# Patient Record
Sex: Female | Born: 1945 | Race: White | Hispanic: No | Smoking: Never smoker
Health system: Southern US, Community
[De-identification: ages and names within clinical notes are randomized; demographics above are authoritative.]

## PROBLEM LIST (undated history)

## (undated) DIAGNOSIS — I609 Nontraumatic subarachnoid hemorrhage, unspecified: Secondary | ICD-10-CM

## (undated) DIAGNOSIS — F32A Depression, unspecified: Secondary | ICD-10-CM

## (undated) DIAGNOSIS — K5792 Diverticulitis of intestine, part unspecified, without perforation or abscess without bleeding: Secondary | ICD-10-CM

## (undated) DIAGNOSIS — F329 Major depressive disorder, single episode, unspecified: Secondary | ICD-10-CM

## (undated) DIAGNOSIS — E785 Hyperlipidemia, unspecified: Secondary | ICD-10-CM

## (undated) DIAGNOSIS — I619 Nontraumatic intracerebral hemorrhage, unspecified: Secondary | ICD-10-CM

## (undated) DIAGNOSIS — R7303 Prediabetes: Secondary | ICD-10-CM

## (undated) DIAGNOSIS — I10 Essential (primary) hypertension: Secondary | ICD-10-CM

## (undated) HISTORY — PX: TONSILLECTOMY: SUR1361

## (undated) HISTORY — DX: Nontraumatic intracerebral hemorrhage, unspecified: I61.9

## (undated) HISTORY — DX: Prediabetes: R73.03

## (undated) HISTORY — DX: Nontraumatic subarachnoid hemorrhage, unspecified: I60.9

## (undated) HISTORY — PX: EYE SURGERY: SHX253

## (undated) HISTORY — DX: Major depressive disorder, single episode, unspecified: F32.9

## (undated) HISTORY — DX: Depression, unspecified: F32.A

## (undated) HISTORY — PX: TUMOR REMOVAL: SHX12

## (undated) HISTORY — DX: Diverticulitis of intestine, part unspecified, without perforation or abscess without bleeding: K57.92

---

## 1998-01-01 ENCOUNTER — Other Ambulatory Visit: Admission: RE | Admit: 1998-01-01 | Discharge: 1998-01-01 | Payer: Self-pay | Admitting: Gynecology

## 1998-03-01 HISTORY — PX: MYOMECTOMY: SHX85

## 2011-01-30 DIAGNOSIS — R7303 Prediabetes: Secondary | ICD-10-CM

## 2011-01-30 HISTORY — DX: Prediabetes: R73.03

## 2012-03-01 DIAGNOSIS — I609 Nontraumatic subarachnoid hemorrhage, unspecified: Secondary | ICD-10-CM

## 2012-03-01 HISTORY — DX: Nontraumatic subarachnoid hemorrhage, unspecified: I60.9

## 2012-05-06 ENCOUNTER — Inpatient Hospital Stay (HOSPITAL_COMMUNITY): Payer: BC Managed Care – PPO

## 2012-05-06 ENCOUNTER — Encounter (HOSPITAL_COMMUNITY): Payer: Self-pay | Admitting: *Deleted

## 2012-05-06 ENCOUNTER — Inpatient Hospital Stay (HOSPITAL_COMMUNITY)
Admission: EM | Admit: 2012-05-06 | Discharge: 2012-05-08 | DRG: 810 | Disposition: A | Discharge status: Home / Self Care | Payer: BC Managed Care – PPO | Source: Other Acute Inpatient Hospital | Attending: Neurosurgery | Admitting: Neurosurgery

## 2012-05-06 DIAGNOSIS — Z882 Allergy status to sulfonamides status: Secondary | ICD-10-CM

## 2012-05-06 DIAGNOSIS — I10 Essential (primary) hypertension: Secondary | ICD-10-CM | POA: Diagnosis present

## 2012-05-06 DIAGNOSIS — Z9089 Acquired absence of other organs: Secondary | ICD-10-CM

## 2012-05-06 DIAGNOSIS — I609 Nontraumatic subarachnoid hemorrhage, unspecified: Principal | ICD-10-CM | POA: Diagnosis present

## 2012-05-06 DIAGNOSIS — Z823 Family history of stroke: Secondary | ICD-10-CM

## 2012-05-06 DIAGNOSIS — E785 Hyperlipidemia, unspecified: Secondary | ICD-10-CM | POA: Diagnosis present

## 2012-05-06 HISTORY — DX: Essential (primary) hypertension: I10

## 2012-05-06 HISTORY — DX: Hyperlipidemia, unspecified: E78.5

## 2012-05-06 LAB — CREATININE, SERUM
Creatinine, Ser: 0.79 mg/dL (ref 0.50–1.10)
GFR calc non Af Amer: 86 mL/min — ABNORMAL LOW (ref >90)

## 2012-05-06 LAB — COMPREHENSIVE METABOLIC PANEL
Alkaline Phosphatase: 65 U/L (ref 39–117)
BUN: 21 mg/dL (ref 6–23)
CO2: 28 mEq/L (ref 19–32)
Calcium: 10 mg/dL (ref 8.4–10.5)
GFR calc Af Amer: >90 mL/min (ref >90)
GFR calc non Af Amer: 88 mL/min — ABNORMAL LOW (ref >90)
Glucose, Bld: 146 mg/dL — ABNORMAL HIGH (ref 70–99)
Total Protein: 6.9 g/dL (ref 6.0–8.3)

## 2012-05-06 LAB — CBC
HCT: 43.3 % (ref 36.0–46.0)
Hemoglobin: 15.3 g/dL — ABNORMAL HIGH (ref 12.0–15.0)
MCH: 33.3 pg (ref 26.0–34.0)
MCHC: 35.3 g/dL (ref 30.0–36.0)

## 2012-05-06 LAB — PROTIME-INR: Prothrombin Time: 12.7 seconds (ref 11.6–15.2)

## 2012-05-06 LAB — APTT: aPTT: 28 seconds (ref 24–37)

## 2012-05-06 LAB — BUN: BUN: 26 mg/dL — ABNORMAL HIGH (ref 6–23)

## 2012-05-06 LAB — SEDIMENTATION RATE: Sed Rate: 16 mm/hr (ref 0–22)

## 2012-05-06 MED ORDER — SODIUM CHLORIDE 0.9 % IV SOLN
INTRAVENOUS | Status: DC
  Administered 2012-05-06 (×2): via INTRAVENOUS

## 2012-05-06 MED ORDER — IOHEXOL 350 MG/ML SOLN
50.0000 mL | Freq: Once | INTRAVENOUS | Status: AC | PRN
  Administered 2012-05-06: 50 mL via INTRAVENOUS

## 2012-05-06 MED ORDER — DIAZEPAM 5 MG PO TABS
ORAL_TABLET | ORAL | Status: AC
  Filled 2012-05-06: qty 1

## 2012-05-06 MED ORDER — ACETAMINOPHEN-CODEINE #3 300-30 MG PO TABS: 1.0000 | ORAL_TABLET | ORAL | Status: DC | PRN

## 2012-05-06 MED ORDER — LABETALOL HCL 5 MG/ML IV SOLN: 10.0000 mg | INTRAVENOUS | Status: DC | PRN

## 2012-05-06 MED ORDER — LISINOPRIL 10 MG PO TABS
10.0000 mg | ORAL_TABLET | Freq: Every day | ORAL | Status: DC
  Administered 2012-05-08: 10 mg via ORAL
  Filled 2012-05-06 (×3): qty 1

## 2012-05-06 MED ORDER — PANTOPRAZOLE SODIUM 40 MG IV SOLR: 40.0000 mg | Freq: Every day | INTRAVENOUS | Status: DC

## 2012-05-06 MED ORDER — IOHEXOL 350 MG/ML SOLN: 50.0000 mL | Freq: Once | INTRAVENOUS | Status: DC | PRN

## 2012-05-06 MED ORDER — ACETAMINOPHEN 650 MG RE SUPP: 650.0000 mg | RECTAL | Status: DC | PRN

## 2012-05-06 MED ORDER — ONDANSETRON HCL 4 MG/2ML IJ SOLN: 4.0000 mg | Freq: Four times a day (QID) | INTRAMUSCULAR | Status: DC | PRN

## 2012-05-06 MED ORDER — HYDROCODONE-ACETAMINOPHEN 5-325 MG PO TABS
1.0000 | ORAL_TABLET | ORAL | Status: DC | PRN
  Administered 2012-05-06 (×2): 1 via ORAL
  Filled 2012-05-06 (×2): qty 1

## 2012-05-06 MED ORDER — DIAZEPAM 5 MG PO TABS
10.0000 mg | ORAL_TABLET | Freq: Once | ORAL | Status: AC
  Administered 2012-05-06: 10 mg via ORAL
  Filled 2012-05-06: qty 1

## 2012-05-06 MED ORDER — GADOBENATE DIMEGLUMINE 529 MG/ML IV SOLN
15.0000 mL | Freq: Once | INTRAVENOUS | Status: AC | PRN
  Administered 2012-05-06: 15 mL via INTRAVENOUS

## 2012-05-06 MED ORDER — ACETAMINOPHEN 325 MG PO TABS: 650.0000 mg | ORAL_TABLET | ORAL | Status: DC | PRN

## 2012-05-06 MED ORDER — NIMODIPINE 30 MG PO CAPS
60.0000 mg | ORAL_CAPSULE | ORAL | Status: DC
  Administered 2012-05-06 (×3): 60 mg via ORAL
  Filled 2012-05-06 (×9): qty 2

## 2012-05-06 MED ORDER — SIMVASTATIN 20 MG PO TABS
20.0000 mg | ORAL_TABLET | Freq: Every day | ORAL | Status: DC
  Administered 2012-05-06 – 2012-05-07 (×2): 20 mg via ORAL
  Filled 2012-05-06 (×3): qty 1

## 2012-05-06 MED ORDER — NIMODIPINE 30 MG/ML ORAL SOLUTION
60.0000 mg | ORAL | Status: DC
  Filled 2012-05-06 (×7): qty 2

## 2012-05-06 MED ORDER — PANTOPRAZOLE SODIUM 40 MG PO TBEC
40.0000 mg | DELAYED_RELEASE_TABLET | Freq: Every day | ORAL | Status: DC
  Administered 2012-05-06 – 2012-05-07 (×2): 40 mg via ORAL
  Filled 2012-05-06 (×2): qty 1

## 2012-05-06 MED ORDER — SENNOSIDES-DOCUSATE SODIUM 8.6-50 MG PO TABS
1.0000 | ORAL_TABLET | Freq: Two times a day (BID) | ORAL | Status: DC
  Administered 2012-05-06 (×2): 1 via ORAL
  Filled 2012-05-06 (×5): qty 1

## 2012-05-06 MED ORDER — NIMODIPINE 30 MG PO CAPS
30.0000 mg | ORAL_CAPSULE | ORAL | Status: DC
  Administered 2012-05-07 (×3): 30 mg via ORAL
  Filled 2012-05-06 (×5): qty 1

## 2012-05-06 NOTE — Progress Notes (Addendum)
MD notified of pt becoming hypotensive after dose of Nimotop. Orders to split dose and give 30 mg q 2 hours.Will continue to monitor pt.  Cyndie Chime Monument
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
## 2012-05-07 DIAGNOSIS — I10 Essential (primary) hypertension: Secondary | ICD-10-CM | POA: Diagnosis present

## 2012-05-07 DIAGNOSIS — E785 Hyperlipidemia, unspecified: Secondary | ICD-10-CM | POA: Diagnosis present

## 2012-05-07 DIAGNOSIS — I609 Nontraumatic subarachnoid hemorrhage, unspecified: Secondary | ICD-10-CM | POA: Diagnosis present

## 2012-05-07 MED ORDER — LORATADINE 10 MG PO TABS
10.0000 mg | ORAL_TABLET | Freq: Every day | ORAL | Status: DC
  Administered 2012-05-07 – 2012-05-08 (×2): 10 mg via ORAL
  Filled 2012-05-07 (×2): qty 1

## 2012-05-07 MED ORDER — ADULT MULTIVITAMIN W/MINERALS CH
1.0000 | ORAL_TABLET | Freq: Every day | ORAL | Status: DC
  Administered 2012-05-07 – 2012-05-08 (×2): 1 via ORAL
  Filled 2012-05-07 (×2): qty 1

## 2012-05-07 MED ORDER — SODIUM CHLORIDE 0.9 % IJ SOLN
10.0000 mL | Freq: Two times a day (BID) | INTRAMUSCULAR | Status: DC
  Administered 2012-05-07: 10 mL via INTRAVENOUS
  Administered 2012-05-07: 3 mL via INTRAVENOUS
  Administered 2012-05-08: 10 mL via INTRAVENOUS

## 2012-05-07 NOTE — Consult Note (Addendum)
Referring Physician: Dr. Venetia Maxon    Chief Complaint: Subarachnoid hemorrhage.  HPI: Debbie Phelps is an 67 y.o. female history of hypertension and hyperlipidemia who experienced a sudden severe headache at around 12 AM on 05/06/2012, followed shortly afterwards by weakness and tingling involving right arm and leg. She was seen in the emergency room at [facility] where CT scan was obtained which showed an acute left intracranial subarachnoid hemorrhage involving the region of the sylvian fissure as well as left parietal region. She was subsequently transferred to [facility] CT angiogram showed no evidence of intracranial aneurysm. MRI of the brain was unremarkable except for subarachnoid hemorrhage. Origin of subarachnoid hemorrhage remains unclear. Headache is markedly diminished in intensity. She is no longer experiencing motor and sensory abnormalities involving right extremities. INR and APTT were normal. Sedimentation rate and ANA are pending. Patient was not on antiplatelet therapy. NIH stroke score 0.  LSN: 12:00 AM on 05/06/2012 tPA Given: No: Acute SAH MRankin: 0  Past Medical History  Diagnosis Date  . Hypertension   . Hyperlipidemia     Family history: Positive for stroke involving paternal grandmother.  Medications:  Scheduled: . lisinopril  10 mg Oral Daily  . loratadine  10 mg Oral Daily  . multivitamin with minerals  1 tablet Oral Daily  . pantoprazole  40 mg Oral Q1200  . senna-docusate  1 tablet Oral BID  . simvastatin  20 mg Oral q1800   EAV:[id number], acetaminophen, acetaminophen-codeine, HYDROcodone-acetaminophen, labetalol, ondansetron (ZOFRAN) IV   Physical Examination: Blood pressure 124/50, pulse 63, temperature 97.6 F (36.4 C), temperature source Oral, resp. rate 20, height 5\' 3"  (1.6 m), weight 74.844 kg (165 lb), SpO2 99.00%.  Neurologic Examination: Mental Status: Alert, oriented, thought content appropriate.  Speech fluent without evidence of  aphasia. Able to follow commands without difficulty. Cranial Nerves: II-Visual fields were normal. III/IV/VI-Pupils were equal and reacted. Extraocular movements were full and conjugate; mild horizontal nystagmus in all fields of gaze.    V/VII-no facial numbness and no facial weakness. VIII-normal. X-normal speech and symmetrical palatal movement. Motor: 5/5 bilaterally with normal tone and bulk Sensory: Normal throughout. Deep Tendon Reflexes: Trace to 1+ and symmetric. Plantars: Flexor bilaterally Cerebellar: Normal finger-to-nose testing. Carotid auscultation: Normal  Ct Angio Head W/cm &/or Wo Cm  05/06/2012  *RADIOLOGY REPORT*  Clinical Data:  Subarachnoid hemorrhage  CT ANGIOGRAPHY HEAD  Technique:  Multidetector CT imaging of the head was performed using the standard protocol during bolus administration of intravenous contrast.  Multiplanar CT image reconstructions including MIPs were obtained to evaluate the vascular anatomy.  Contrast: 50mL OMNIPAQUE IOHEXOL 350 MG/ML SOLN  Comparison:  CT head 05/06/2012  Findings:  Subarachnoid hemorrhage over the convexity on the left is unchanged from earlier today.  There is a mild amount of subarachnoid blood present.  No subdural or intraparenchymal hemorrhage is seen.  There is a small amount of blood in the sylvian fissure on the left.  There is no blood at the base of the brain.  No acute infarct or mass.  Ventricle size is normal.  No enhancing mass lesion is seen following contrast infusion.  Both vertebral arteries are patent to the basilar.  PICA is patent bilaterally.  The basilar is widely patent.  Bilateral AICA, superior cerebellar, and posterior cerebral arteries are patent without stenosis.  Cavernous carotid is patent bilaterally without evidence of atherosclerotic disease or aneurysm.  Anterior and  middle cerebral arteries are patent bilaterally without stenosis.  Negative for aneurysm.  No vascular malformation or cause of subarachnoid  hemorrhage is identified.   Review of the MIP images confirms the above findings.  IMPRESSION: Subarachnoid hemorrhage over the convexity on the left, unchanged. No cause for hemorrhage is identified on CTA.   Original Report Authenticated By: Janeece Riggers, M.D.    Mr Laqueta Jean JX Contrast  05/06/2012  *RADIOLOGY REPORT*  Clinical Data: Subarachnoid hemorrhage.  Rule out tumor or vascular malformation  MRI HEAD WITHOUT AND WITH CONTRAST  Technique:  Multiplanar, multiecho pulse sequences of the brain and surrounding structures were obtained according to standard protocol without and with intravenous contrast  Contrast: 15mL MULTIHANCE GADOBENATE DIMEGLUMINE 529 MG/ML IV SOLN  Comparison: CTA from today  Findings: Subarachnoid hemorrhage over the convexity on the left is unchanged from the CT.  No subdural hemorrhage is present.  No edema is present in the brain.  No midline shift.  Ventricle size is normal.  No acute ischemic infarct.  No mass lesion is identified.  Postcontrast imaging reveals normal enhancement.  No underlying vascular malformation or mass is identified.  The underlying calvarium is intact.  IMPRESSION: Subarachnoid hemorrhage on the left.  No cause for hemorrhage is identified.   Original Report Authenticated By: Janeece Riggers, M.D.     Assessment: 67 y.o. female presenting with acute left intracranial subarachnoid hemorrhage of unclear source for hemorrhage. Aneurysm was not demonstrated on CT angiogram. No acute stroke was demonstrated on MRI study of the brain. Clotting times were normal.  Stroke Risk Factors - family history, hyperlipidemia and hypertension  Recommendations: 1. Agree with obtaining ESR and ANA to rule out indications of systemic vasculitis. 2. Workup to assess overall risk for stroke, including hemoglobin A1c, fasting lipid panel and 2D echocardiogram. 3. We'll have followup tomorrow with [facility] stroke team for further recommendations, if any.  C.R. Roseanne Reno, MD [facility]  [phone]  05/07/2012, 11:47 AM

## 2012-05-07 NOTE — Progress Notes (Signed)
Subjective: Patient reports feeling better this morning, but still some headache  Objective: Vital signs in last 24 hours: Temp:  [97.5 F (36.4 C)-98.6 F (37 C)] 97.8 F (36.6 C) (03/09 0355) Pulse Rate:  [56-85] 58 (03/09 0500) Resp:  [13-26] 14 (03/09 0500) BP: (85-131)/(33-77) 88/34 mmHg (03/09 0500) SpO2:  [91 %-99 %] 93 % (03/09 0500)  Intake/Output from previous day: 03/08 0701 - 03/09 0700 In: 2736.3 [P.O.:1320; I.V.:1416.3] Out: 2375 [Urine:2375] Intake/Output this shift: Total I/O In: 1275 [P.O.:600; I.V.:675] Out: 950 [Urine:950]  Physical Exam: PERRL, EOMI.  MAEW. Alert, oriented, conversant.  Denies any right arm symptoms.  Lab Results:  Recent Labs  05/06/12 0850  WBC 8.0  HGB 15.3*  HCT 43.3  PLT 220   BMET  Recent Labs  05/06/12 0450 05/06/12 0850  NA  --  144  K  --  4.4  CL  --  106  CO2  --  28  GLUCOSE  --  146*  BUN 26* 21  CREATININE 0.79 0.72  CALCIUM  --  10.0    Studies/Results: Ct Angio Head W/cm &/or Wo Cm  05/06/2012  *RADIOLOGY REPORT*  Clinical Data:  Subarachnoid hemorrhage  CT ANGIOGRAPHY HEAD  Technique:  Multidetector CT imaging of the head was performed using the standard protocol during bolus administration of intravenous contrast.  Multiplanar CT image reconstructions including MIPs were obtained to evaluate the vascular anatomy.  Contrast: 50mL OMNIPAQUE IOHEXOL 350 MG/ML SOLN  Comparison:  CT head 05/06/2012  Findings:  Subarachnoid hemorrhage over the convexity on the left is unchanged from earlier today.  There is a mild amount of subarachnoid blood present.  No subdural or intraparenchymal hemorrhage is seen.  There is a small amount of blood in the sylvian fissure on the left.  There is no blood at the base of the brain.  No acute infarct or mass.  Ventricle size is normal.  No enhancing mass lesion is seen following contrast infusion.  Both vertebral arteries are patent to the basilar.  PICA is patent bilaterally.  The  basilar is widely patent.  Bilateral AICA, superior cerebellar, and posterior cerebral arteries are patent without stenosis.  Cavernous carotid is patent bilaterally without evidence of atherosclerotic disease or aneurysm.  Anterior and  middle cerebral arteries are patent bilaterally without stenosis.  Negative for aneurysm.  No vascular malformation or cause of subarachnoid hemorrhage is identified.   Review of the MIP images confirms the above findings.  IMPRESSION: Subarachnoid hemorrhage over the convexity on the left, unchanged. No cause for hemorrhage is identified on CTA.   Original Report Authenticated By: Janeece Riggers, M.D.    Mr Laqueta Jean AV Contrast  05/06/2012  *RADIOLOGY REPORT*  Clinical Data: Subarachnoid hemorrhage.  Rule out tumor or vascular malformation  MRI HEAD WITHOUT AND WITH CONTRAST  Technique:  Multiplanar, multiecho pulse sequences of the brain and surrounding structures were obtained according to standard protocol without and with intravenous contrast  Contrast: 15mL MULTIHANCE GADOBENATE DIMEGLUMINE 529 MG/ML IV SOLN  Comparison: CTA from today  Findings: Subarachnoid hemorrhage over the convexity on the left is unchanged from the CT.  No subdural hemorrhage is present.  No edema is present in the brain.  No midline shift.  Ventricle size is normal.  No acute ischemic infarct.  No mass lesion is identified.  Postcontrast imaging reveals normal enhancement.  No underlying vascular malformation or mass is identified.  The underlying calvarium is intact.  IMPRESSION: Subarachnoid hemorrhage on the left.  No cause for hemorrhage is identified.   Original Report Authenticated By: Janeece Riggers, M.D.     Assessment/Plan: MRI negative, Sed rate 16, awaiting remainder of vasculitis workup.  Will have Neurology (Dr. Roseanne Reno) see her to evaluate for other reasons for subarachnoid hemorrhage.  I will transfer to floor.  I have discontinued nimodipine, given small hemorrhage and this was dropping  her blood pressure, even with split doses.    LOS: 1 day    Dorian Heckle, MD 05/07/2012, 5:20 AM

## 2012-05-08 DIAGNOSIS — I609 Nontraumatic subarachnoid hemorrhage, unspecified: Principal | ICD-10-CM

## 2012-05-08 DIAGNOSIS — I10 Essential (primary) hypertension: Secondary | ICD-10-CM

## 2012-05-08 DIAGNOSIS — E785 Hyperlipidemia, unspecified: Secondary | ICD-10-CM

## 2012-05-08 NOTE — Progress Notes (Signed)
OK to D/C home.  F/U with Dr. Pearlean Brownie as an outpatient for EEG and workup of SAH.

## 2012-05-08 NOTE — Progress Notes (Signed)
Stroke Team Progress Note  HISTORY Debbie Phelps is an 67 y.o. female history of hypertension and hyperlipidemia who experienced a sudden severe headache at around 12 AM on 05/06/2012, followed shortly afterwards by weakness and tingling involving right arm and leg. She was seen in the emergency room at [facility] where CT scan was obtained which showed an acute left intracranial subarachnoid hemorrhage involving the region of the sylvian fissure as well as left parietal region. She was subsequently transferred to [facility]. CT angiogram showed no evidence of intracranial aneurysm. MRI of the brain was unremarkable except for subarachnoid hemorrhage. Origin of subarachnoid hemorrhage remains unclear. Headache is markedly diminished in intensity. She is no longer experiencing motor and sensory abnormalities involving right extremities. INR and APTT were normal. Sedimentation rate and ANA are pending. Patient was not on antiplatelet therapy. NIH stroke score 0. Patient was not a TPA candidate secondary to [facility]. She was admitted for further evaluation and treatment.  SUBJECTIVE Her friend is at the bedside.  Overall she feels her condition is completely resolved. She reports a recent visit to a nutritionist who put her on multiple pills tid that she is not sure what they all are.  OBJECTIVE Most recent Vital Signs: Filed Vitals:   05/07/12 2255 05/08/12 0224 05/08/12 0543 05/08/12 0923  BP: 125/50 109/53 124/57 128/67  Pulse: 66 60 60 75  Temp: 97.9 F (36.6 C) 97.8 F (36.6 C) 97.8 F (36.6 C) 99 F (37.2 C)  TempSrc: Oral Oral Oral Oral  Resp: 16 16 16 17   Height:      Weight:      SpO2: 96% 98% 100% 98%   CBG (last 3)  No results found for this basename: GLUCAP,  in the last 72 hours  IV Fluid Intake:     MEDICATIONS  . lisinopril  10 mg Oral Daily  . loratadine  10 mg Oral Daily  . multivitamin with minerals  1 tablet Oral Daily  . pantoprazole  40 mg Oral Q1200  .  senna-docusate  1 tablet Oral BID  . simvastatin  20 mg Oral q1800  . sodium chloride  10 mL Intravenous Q12H   PRN:  acetaminophen, acetaminophen, acetaminophen-codeine, HYDROcodone-acetaminophen, labetalol, ondansetron (ZOFRAN) IV  Diet:  General thin liquids Activity:    Up with assistance DVT Prophylaxis:  SCDs   CLINICALLY SIGNIFICANT STUDIES Basic Metabolic Panel:  Recent Labs Lab 05/06/12 0450 05/06/12 0850  NA  --  144  K  --  4.4  CL  --  106  CO2  --  28  GLUCOSE  --  146*  BUN 26* 21  CREATININE 0.79 0.72  CALCIUM  --  10.0   Liver Function Tests:  Recent Labs Lab 05/06/12 0850  AST 21  ALT 20  ALKPHOS 65  BILITOT 0.5  PROT 6.9  ALBUMIN 3.9   CBC:  Recent Labs Lab 05/06/12 0850  WBC 8.0  HGB 15.3*  HCT 43.3  MCV 94.1  PLT 220   Coagulation:  Recent Labs Lab 05/06/12 0850  LABPROT 12.7  INR 0.96   Cardiac Enzymes: No results found for this basename: CKTOTAL, CKMB, CKMBINDEX, TROPONINI,  in the last 168 hours Urinalysis: No results found for this basename: COLORURINE, APPERANCEUR, LABSPEC, PHURINE, GLUCOSEU, HGBUR, BILIRUBINUR, KETONESUR, PROTEINUR, UROBILINOGEN, NITRITE, LEUKOCYTESUR,  in the last 168 hours Lipid Panel No results found for this basename: chol, trig, hdl, cholhdl, vldl, ldlcalc   HgbA1C  No results found for this basename: HGBA1C  Urine Drug Screen:   No results found for this basename: labopia, cocainscrnur, labbenz, amphetmu, thcu, labbarb    Alcohol Level: No results found for this basename: ETH,  in the last 168 hours  CT angio of the brain  Subarachnoid hemorrhage over the convexity on the left, unchanged. No cause for hemorrhage is identified on CTA.  MRI of the brain  05/06/2012   Subarachnoid hemorrhage on the left.  No cause for hemorrhage is identified.   Therapy Recommendations no OT,   Physical Exam    ASSESSMENT Debbie Phelps is a 67 y.o. female presenting with sudden severe headache and right  arm weakness and tingling.  She describes marching type symptoms, ? Seizure. Imaging confirms a left convexity localized subarachnoid hemorrhage. Hemorrhage felt to be secondary to possible cortical venous anomaly and aneurysm (suspicion very low) or RCVS (reversible cerebral vasoconstriction syndrome - often triggered by medications, hypertensive crisis, lupus, etc.), or small cortical vein abnormality that bled during coital onset.  ANA normal. On no antiplatelets prior to admission.  Patient with no resultant neuro symptoms.   Hospital day # 2  TREATMENT/PLAN  Recommend cerebral angio to look for small aneurym or other cause of hemorrhage as an OP next week or so  Stop all herbal nutritional supplements  OP EEG to eval for possible seizure. This is the highest risk event for this patient. Dr. Pearlean Brownie also discussed sensory seizures and how to recognize them. Dr. Pearlean Brownie can do the EEG in his office if desired.  F/u CRP and ANA  Ok for discharge later today  Follow up Dr. Pearlean Brownie in 1 week.  Dr. Pearlean Brownie discussed diagnosis, prognosis and plan of care with patient and Dr. Mal Amabile, MSN, RN, ANVP-BC, ANP-BC, GNP-BC [facility] Pager: [phone] 05/08/2012 11:19 AM  I have personally obtained a history, examined the patient, evaluated imaging results, and formulated the assessment and plan of care. I agree with the above.  Delia Heady, MD

## 2012-05-08 NOTE — Progress Notes (Signed)
Subjective: Patient reports "I'm ready to go, I think"  Objective: Vital signs in last 24 hours: Temp:  [97.8 F (36.6 C)-99 F (37.2 C)] 99 F (37.2 C) (03/10 0923) Pulse Rate:  [60-75] 75 (03/10 0923) Resp:  [16-20] 17 (03/10 0923) BP: (109-152)/(50-67) 128/67 mmHg (03/10 0923) SpO2:  [96 %-100 %] 98 % (03/10 0923)  Intake/Output from previous day: 03/09 0701 - 03/10 0700 In: 390 [P.O.:240; I.V.:150] Out: 525 [Urine:525] Intake/Output this shift:    Dressed, sitting up in chair, talking with PT.  Denies pain, discomfort, h/a, or dizziness. PEARL.   Lab Results:  Recent Labs  05/06/12 0850  WBC 8.0  HGB 15.3*  HCT 43.3  PLT 220   BMET  Recent Labs  05/06/12 0450 05/06/12 0850  NA  --  144  K  --  4.4  CL  --  106  CO2  --  28  GLUCOSE  --  146*  BUN 26* 21  CREATININE 0.79 0.72  CALCIUM  --  10.0    Studies/Results: Mr Laqueta Jean HY Contrast  05/06/2012  *RADIOLOGY REPORT*  Clinical Data: Subarachnoid hemorrhage.  Rule out tumor or vascular malformation  MRI HEAD WITHOUT AND WITH CONTRAST  Technique:  Multiplanar, multiecho pulse sequences of the brain and surrounding structures were obtained according to standard protocol without and with intravenous contrast  Contrast: 15mL MULTIHANCE GADOBENATE DIMEGLUMINE 529 MG/ML IV SOLN  Comparison: CTA from today  Findings: Subarachnoid hemorrhage over the convexity on the left is unchanged from the CT.  No subdural hemorrhage is present.  No edema is present in the brain.  No midline shift.  Ventricle size is normal.  No acute ischemic infarct.  No mass lesion is identified.  Postcontrast imaging reveals normal enhancement.  No underlying vascular malformation or mass is identified.  The underlying calvarium is intact.  IMPRESSION: Subarachnoid hemorrhage on the left.  No cause for hemorrhage is identified.   Original Report Authenticated By: Janeece Riggers, M.D.     Assessment/Plan: Improved   LOS: 2 days  Per Dr.  Marca Ancona note, Stroke Team will visit today.  Will discuss plan with Dr. Venetia Maxon this am.   Georgiann Cocker 05/08/2012, 9:59 AM

## 2012-05-08 NOTE — Discharge Summary (Signed)
Physician Discharge Summary  Patient ID: Debbie Phelps MRN: [id number] DOB/AGE: January 30, 1946 67 y.o.  Admit date: 05/06/2012 Discharge date: 05/08/2012  Admission Diagnoses:Subarachnoid hemorrhage  Discharge Diagnoses: Same Active Problems:   SAH (subarachnoid hemorrhage)   Essential hypertension, benign   Other and unspecified hyperlipidemia   Discharged Condition: good  Hospital Course: Workup did not reveal source for hemorrhage, including CTA, MRI.  Vasculitis work up negative.  Consults: neurology  Significant Diagnostic Studies: labs: CRP, ANA, Sed Rate, MRI brain, CTAngiogram  Treatments: IV hydration, analgesics  Discharge Exam: Blood pressure 128/67, pulse 75, temperature 99 F (37.2 C), temperature source Oral, resp. rate 17, height 5\' 3"  (1.6 m), weight 74.844 kg (165 lb), SpO2 98.00%. Neurologic: Alert and oriented X 3, normal strength and tone. Normal symmetric reflexes. Normal coordination and gait  Disposition: Home     Medication List    STOP taking these medications       FISH OIL PO     OVER THE COUNTER MEDICATION      TAKE these medications       CALCIUM + D PO  Take 1 tablet by mouth daily.     CLARITIN PO  Take 1 tablet by mouth daily.     lisinopril 10 MG tablet  Commonly known as:  PRINIVIL,ZESTRIL  Take 10 mg by mouth daily.     multivitamin with minerals Tabs  Take 1 tablet by mouth daily.     simvastatin 20 MG tablet  Commonly known as:  ZOCOR  Take 20 mg by mouth daily.     vitamin C 1000 MG tablet  Take 1,000 mg by mouth daily.         Signed: Dorian Heckle, MD 05/08/2012, 12:22 PM

## 2012-05-08 NOTE — Evaluation (Signed)
Occupational Therapy Evaluation Patient Details Name: Debbie Phelps MRN: [id number] DOB: October 05, 1945 Today's Date: 05/08/2012 Time: 4098-1191 OT Time Calculation (min): 28 min  OT Assessment / Plan / Recommendation Clinical Impression  This 67 y.o. female admitted with Rt. parietal SAH with no obvious source.  Pt. has returned to baseline with no deficits noted.  Education completed.  No further OT needs identified    OT Assessment  Patient does not need any further OT services    Follow Up Recommendations  No OT follow up    Barriers to Discharge      Equipment Recommendations  None recommended by OT    Recommendations for Other Services    Frequency       Precautions / Restrictions Precautions Precautions: None       ADL  Eating/Feeding: Independent Where Assessed - Eating/Feeding: Chair Grooming: Wash/dry hands;Wash/dry face;Teeth care;Brushing hair;Applying makeup;Independent Where Assessed - Grooming: Unsupported standing Upper Body Bathing: Independent Where Assessed - Upper Body Bathing: Unsupported standing Lower Body Bathing: Independent Where Assessed - Lower Body Bathing: Unsupported standing Upper Body Dressing: Independent Where Assessed - Upper Body Dressing: Unsupported sitting Lower Body Dressing: Independent Where Assessed - Lower Body Dressing: Unsupported sit to stand Toilet Transfer: Independent Toilet Transfer Method: Sit to Barista: Comfort height toilet Toileting - Clothing Manipulation and Hygiene: Independent Where Assessed - Engineer, mining and Hygiene: Standing Tub/Shower Transfer: Designer, industrial/product Method: Ambulating Transfers/Ambulation Related to ADLs: Pt ambulates independently ADL Comments: Pt. reports she showered yesterday.  Pt is dressed and hoping to go home.  She has read the newspaper without difficulty, was texting on phone upon therapists enterance.  She feel she is  back to baseline.  Discussed with her residual fatigue that may occur and how it may impact IADLs including work, and to take rest breaks as necessary.     OT Diagnosis:    OT Problem List:   OT Treatment Interventions:     OT Goals    Visit Information  Last OT Received On: 05/08/12    Subjective Data  Subjective: "I think I will probably take the week off from work to ease back in" Patient Stated Goal: To go home   Prior Functioning     Home Living Lives With: Alone Available Help at Discharge: Available PRN/intermittently;Friend(s) Type of Home: House Home Access: Level entry Home Layout: One level Bathroom Shower/Tub: Engineer, manufacturing systems: Standard Home Adaptive Equipment: None Prior Function Level of Independence: Independent Able to Take Stairs?: Yes Driving: Yes Vocation: Full time employment Comments: works as a Teacher, adult education: No difficulties Dominant Hand: Right         Vision/Perception Vision - History Baseline Vision: 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) Visual History: Corrective eye surgery (for strabissmus) Patient Visual Report: No change from baseline Vision - Assessment Eye Alignment: Impaired (comment) (congenital strabissmus) Vision Assessment: Vision tested Ocular Range of Motion: Within Functional Limits Visual Fields: No apparent deficits Additional Comments: Pt able to read small print without difficulty, has located all items in room, navigated hallway without difficulty Perception Perception: Within Functional Limits Praxis Praxis: Intact   Cognition  Cognition Overall Cognitive Status: Appears within functional limits for tasks assessed/performed Arousal/Alertness: Awake/alert Orientation Level: Appears intact for tasks assessed Behavior During Session: [facility] for tasks performed    Extremity/Trunk Assessment Right Upper Extremity Assessment RUE ROM/Strength/Tone:  Within functional levels RUE Sensation: WFL - Light Touch RUE Coordination: WFL - gross/fine motor  Left Upper Extremity Assessment LUE ROM/Strength/Tone: Within functional levels LUE Sensation: WFL - Light Touch LUE Coordination: WFL - gross/fine motor Trunk Assessment Trunk Assessment: Normal     Mobility Bed Mobility Bed Mobility: Not assessed (Pt reports independence) Transfers Transfers: Sit to Stand;Stand to Sit Sit to Stand: 7: Independent Stand to Sit: 7: Independent Details for Transfer Assistance: No balance loss     Exercise     Balance     End of Session OT - End of Session Activity Tolerance: Patient tolerated treatment well Patient left: in chair  GO     Conarpe, Wendi M 05/08/2012, 10:07 AM

## 2012-05-09 LAB — ANTI-NUCLEAR AB-TITER (ANA TITER): ANA Titer 1: 1:40 {titer} — ABNORMAL HIGH

## 2012-05-17 ENCOUNTER — Telehealth: Payer: Self-pay | Admitting: *Deleted

## 2012-05-17 ENCOUNTER — Encounter: Payer: Self-pay | Admitting: Neurology

## 2012-05-17 ENCOUNTER — Ambulatory Visit (INDEPENDENT_AMBULATORY_CARE_PROVIDER_SITE_OTHER): Payer: BC Managed Care – PPO | Admitting: Neurology

## 2012-05-17 VITALS — BP 134/61 | HR 82 | Temp 98.1°F | Ht 62.5 in | Wt 169.0 lb

## 2012-05-17 DIAGNOSIS — R209 Unspecified disturbances of skin sensation: Secondary | ICD-10-CM | POA: Insufficient documentation

## 2012-05-17 DIAGNOSIS — R569 Unspecified convulsions: Secondary | ICD-10-CM | POA: Insufficient documentation

## 2012-05-17 DIAGNOSIS — I619 Nontraumatic intracerebral hemorrhage, unspecified: Secondary | ICD-10-CM

## 2012-05-17 MED ORDER — LEVETIRACETAM 500 MG PO TABS: 250.0000 mg | ORAL_TABLET | Freq: Two times a day (BID) | ORAL | Status: DC

## 2012-05-17 NOTE — Telephone Encounter (Signed)
Patient  had a Subarachnoid Hemorrhage on 05-06-2012 and stated since then she has been having seizures on her right side. Patient took ibuprofen sat 05-12-2012 which sent her to the hospital, patient stated her bleed started again which had been confirmed  Via  scan that showed she had a bleed but has stopped. Patient stated last night she had a spell of numbness on her whole right side it lasted for about . Patient wanted to know if you can give her something for her seizures. Please advise

## 2012-05-17 NOTE — Patient Instructions (Signed)
Plan start Keppra 250 mg twice a day for this sensory paresthesia episodes which may represent simple partial seizures. I discussed side effects with the patient and advised her to call me if needed. Check EEG for silent seizure activity and patient has already been scheduled to have a diagnostic cerebral catheter angiogram by radiology. She may return to work after one week. Return for followup with me in 3 months.

## 2012-05-17 NOTE — Progress Notes (Signed)
HPI: Debbie Phelps is a 62 year or Caucasian lady seen for followup after hospital consultation for brain hemorrhage on 05/06/12.she presented with sudden onset of severe headache followed shortly by tingling and weakness involving the right arm and leg. CT scan of the head and [facility] showed an acute left parietal subarachnoid hemorrhage in the region of the sylvian fissure and left parietal lobe. She was transferred to [facility] where a CT angiogram showed no evidence of intracranial aneurysm or AV malformation. MRI scan of the brain was unremarkable except for localized subarachnoid hemorrhage in this ago areas. The etiology for the origin of subarachnoid hemorrhage remained unclear. Cerebral catheter angiogram was not up [facility] interventional radiologist was out of town and was scheduled to be done as an outpatient next week. Patient's headache resolved but she started having some intermittent paresthesias on the right side after discharge. Rest of the evaluation in the hospital stay included ESR, ANA panel, correlation labs which were all negative. She was discharged to 4 days later and on 05/13/12 she developed sudden onset of paresthesias involving the right 40. This happened after she had taken 2 doses of ibuprofen for her headache when she had gone [place] a [place]. She had a repeat CT scan at [facility] which actually showed slightly improved appearance of the left parietal subarachnoid hemorrhage without any new findings..she had been taking some herbal pills for the last couple of months and was advised to stop them.  The patient called the office saying she has had 2 episodes of transient right hemibody paresthesias in the last 2 days requesting to be seen urgently and hence was worked into the schedule today ROS: 14 system review of systems positive for itching, allergies, headache, numbness, weakness, seizure, anxiety, not enough sleep, decreased energy and  change in appetite. Physical Exam General: well developed, well nourished middle aged Caucasian lady seated, in no evident distress but appears anxious Head: head normocephalic and atraumatic. Orohparynx benign Neck: supple with no carotid or supraclavicular bruits Cardiovascular: regular rate and rhythm, no murmurs Lungs are clear to auscultation Neurologic Exam Mental Status: Awake and fully alert. Oriented to place and time. Recent and remote memory intact. Attention span, concentration and fund of knowledge appropriate. Mood and affect appropriate.  Cranial Nerves: Fundoscopic exam reveals sharp disc margins. Pupils equal, briskly reactive to light. Extraocular movements full without nystagmus. Visual fields full to confrontation. Hearing intact and symmetric to finer snap. Facial sensation intact. Face, tongue, palate move normally and symmetrically. Neck flexion and extension normal.  Motor: Normal bulk and tone. Normal strength in all tested extremity muscles. Sensory.: intact to tough and pinprick and vibratory.  Coordination: Rapid alternating movements normal in all extremities. Finger-to-nose and heel-to-shin performed accurately bilaterally. Gait and Station: Arises from chair without difficulty. Stance is normal. Gait demonstrates normal stride length and balance . Able to heel, toe and tandem walk without difficulty.  Reflexes: 1+ and symmetric. Toes downgoing.     ASSESSMENT::  70 lady with left parietal convexity localize subarachnoid hemorrhage of unclear etiology possibly small   cortical vein thrombosis. 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 Tiny aneurysm not seen on CT angiogram is very low probability. 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 Check EEG for seizure activity. Patient has her cerebral  catheter angiogram scheduled with Dr. Corliss Skains next week.  Return for followup in 3 months or call earlier if necessary.She may return to work on for one week.

## 2012-05-17 NOTE — Telephone Encounter (Signed)
If three-month visit spot is not available change it to 4 months

## 2012-05-18 ENCOUNTER — Other Ambulatory Visit: Payer: Self-pay | Admitting: Neurology

## 2012-05-18 DIAGNOSIS — I609 Nontraumatic subarachnoid hemorrhage, unspecified: Secondary | ICD-10-CM

## 2012-05-19 ENCOUNTER — Other Ambulatory Visit (INDEPENDENT_AMBULATORY_CARE_PROVIDER_SITE_OTHER): Payer: BC Managed Care – PPO

## 2012-05-19 ENCOUNTER — Other Ambulatory Visit: Payer: Self-pay | Admitting: Neurology

## 2012-05-19 ENCOUNTER — Other Ambulatory Visit: Payer: BC Managed Care – PPO

## 2012-05-19 DIAGNOSIS — R569 Unspecified convulsions: Secondary | ICD-10-CM

## 2012-05-19 DIAGNOSIS — I609 Nontraumatic subarachnoid hemorrhage, unspecified: Secondary | ICD-10-CM

## 2012-05-25 ENCOUNTER — Other Ambulatory Visit: Payer: Self-pay | Admitting: Radiology

## 2012-05-31 ENCOUNTER — Encounter (HOSPITAL_COMMUNITY): Payer: Self-pay | Admitting: Pharmacy Technician

## 2012-06-01 ENCOUNTER — Ambulatory Visit (HOSPITAL_COMMUNITY)
Admission: RE | Admit: 2012-06-01 | Discharge: 2012-06-01 | Disposition: A | Discharge status: Home / Self Care | Payer: BC Managed Care – PPO | Source: Ambulatory Visit | Attending: Neurology | Admitting: Neurology

## 2012-06-01 ENCOUNTER — Encounter (HOSPITAL_COMMUNITY): Payer: Self-pay

## 2012-06-01 ENCOUNTER — Other Ambulatory Visit: Payer: Self-pay | Admitting: Neurology

## 2012-06-01 DIAGNOSIS — I609 Nontraumatic subarachnoid hemorrhage, unspecified: Secondary | ICD-10-CM

## 2012-06-01 DIAGNOSIS — E785 Hyperlipidemia, unspecified: Secondary | ICD-10-CM | POA: Insufficient documentation

## 2012-06-01 DIAGNOSIS — I10 Essential (primary) hypertension: Secondary | ICD-10-CM | POA: Insufficient documentation

## 2012-06-01 DIAGNOSIS — R29898 Other symptoms and signs involving the musculoskeletal system: Secondary | ICD-10-CM | POA: Insufficient documentation

## 2012-06-01 DIAGNOSIS — Z8673 Personal history of transient ischemic attack (TIA), and cerebral infarction without residual deficits: Secondary | ICD-10-CM | POA: Insufficient documentation

## 2012-06-01 DIAGNOSIS — R51 Headache: Secondary | ICD-10-CM | POA: Insufficient documentation

## 2012-06-01 LAB — BASIC METABOLIC PANEL
Calcium: 9.9 mg/dL (ref 8.4–10.5)
GFR calc Af Amer: 85 mL/min — ABNORMAL LOW (ref >90)
GFR calc non Af Amer: 73 mL/min — ABNORMAL LOW (ref >90)
Glucose, Bld: 125 mg/dL — ABNORMAL HIGH (ref 70–99)
Potassium: 4.4 mEq/L (ref 3.5–5.1)
Sodium: 140 mEq/L (ref 135–145)

## 2012-06-01 LAB — PROTIME-INR
INR: 0.91 (ref 0.00–1.49)
Prothrombin Time: 12.2 seconds (ref 11.6–15.2)

## 2012-06-01 LAB — CBC WITH DIFFERENTIAL/PLATELET
Basophils Absolute: 0 10*3/uL (ref 0.0–0.1)
Basophils Relative: 0 % (ref 0–1)
Lymphocytes Relative: 31 % (ref 12–46)
MCHC: 35.3 g/dL (ref 30.0–36.0)
Neutro Abs: 3.1 10*3/uL (ref 1.7–7.7)
Neutrophils Relative %: 60 % (ref 43–77)
Platelets: 191 10*3/uL (ref 150–400)
RDW: 12.7 % (ref 11.5–15.5)
WBC: 5.1 10*3/uL (ref 4.0–10.5)

## 2012-06-01 LAB — APTT: aPTT: 20 seconds — ABNORMAL LOW (ref 24–37)

## 2012-06-01 MED ORDER — FENTANYL CITRATE 0.05 MG/ML IJ SOLN
INTRAMUSCULAR | Status: AC | PRN
  Administered 2012-06-01: 25 ug via INTRAVENOUS

## 2012-06-01 MED ORDER — HEPARIN SOD (PORK) LOCK FLUSH 100 UNIT/ML IV SOLN
INTRAVENOUS | Status: AC | PRN
  Administered 2012-06-01: 500 [IU] via INTRAVENOUS

## 2012-06-01 MED ORDER — IOHEXOL 300 MG/ML  SOLN
150.0000 mL | Freq: Once | INTRAMUSCULAR | Status: AC | PRN
  Administered 2012-06-01: 60 mL via INTRAVENOUS

## 2012-06-01 MED ORDER — MIDAZOLAM HCL 2 MG/2ML IJ SOLN
INTRAMUSCULAR | Status: AC | PRN
  Administered 2012-06-01: 1 mg via INTRAVENOUS

## 2012-06-01 MED ORDER — MIDAZOLAM HCL 2 MG/2ML IJ SOLN
INTRAMUSCULAR | Status: AC
  Filled 2012-06-01: qty 4

## 2012-06-01 MED ORDER — SODIUM CHLORIDE 0.9 % IV SOLN
INTRAVENOUS | Status: AC | PRN
  Administered 2012-06-01: 75 mL/h via INTRAVENOUS

## 2012-06-01 MED ORDER — SODIUM CHLORIDE 0.9 % IV SOLN: INTRAVENOUS | Status: DC

## 2012-06-01 MED ORDER — SODIUM CHLORIDE 0.9 % IV SOLN: INTRAVENOUS | Status: AC

## 2012-06-01 MED ORDER — FENTANYL CITRATE 0.05 MG/ML IJ SOLN
INTRAMUSCULAR | Status: AC
  Filled 2012-06-01: qty 2

## 2012-06-01 NOTE — Procedures (Signed)
S/P 4 vessle cerebral arteriogram . RT CFA approach. Findings .Marland Kitchen 1. No aneurysms,AVMs ,dissections or AV shunting seen. 2. No occlusions or stenosis seen. 3.Venous outflow WNLs.

## 2012-06-01 NOTE — H&P (Signed)
Debbie Phelps is an 67 y.o. female.   Chief Complaint: Headache; rt sided weakness Went to [facility] ER 05/06/2012 Transferred to [facility]: MRI/CTA reveals SAH; no source noted Pt has had some Rt arm numbness; tingling-- being treated with Keppra Scheduled now for cerebral arteriogram  HPI: HTN; HLD  Past Medical History  Diagnosis Date  . Hypertension   . Hyperlipidemia     Past Surgical History  Procedure Laterality Date  . Tonsillectomy    . Eye surgery    . Myomectomy  2000    History reviewed. No pertinent family history. Social History:  reports that she has never smoked. She has never used smokeless tobacco. She reports that she drinks about 1.2 ounces of alcohol per week. She reports that she does not use illicit drugs.  Allergies:  Allergies  Allergen Reactions  . Sulfa Antibiotics Other (See Comments)    Pt states "hands were stiff"     (Not in a hospital admission)  No results found for this or any previous visit (from the past 48 hour(s)). No results found.  Review of Systems  Constitutional: Negative for fever and weight loss.  Eyes: Negative for blurred vision.  Respiratory: Negative for shortness of breath.   Cardiovascular: Negative for chest pain.  Gastrointestinal: Negative for nausea, vomiting and abdominal pain.  Neurological: Positive for headaches. Negative for dizziness and weakness.    Blood pressure 134/73, pulse 70, temperature 97.7 F (36.5 C), temperature source Oral, resp. rate 20, height 5\' 3"  (1.6 m), weight 169 lb (76.658 kg), SpO2 98.00%. Physical Exam  Constitutional: She is oriented to person, place, and time. She appears well-developed and well-nourished.  Cardiovascular: Normal rate, regular rhythm and normal heart sounds.   No murmur heard. Respiratory: Effort normal and breath sounds normal. She has no wheezes.  GI: Soft. Bowel sounds are normal. There is no tenderness.  Musculoskeletal: Normal range of motion.   Neurological: She is alert and oriented to person, place, and time.  Psychiatric: She has a normal mood and affect. Her behavior is normal. Judgment and thought content normal.     Assessment/Plan HA evaluated in [facility] ER 05/06/12 and then to [facility] on MRI/CTA Scheduled now for cerebral arteriogram Pt aware of procedure benefits and risks and agreeable to proceed Consent signed and in chart  Debbie Phelps A 06/01/2012, 10:45 AM

## 2012-06-07 DIAGNOSIS — Z0289 Encounter for other administrative examinations: Secondary | ICD-10-CM

## 2012-06-08 ENCOUNTER — Telehealth: Payer: Self-pay | Admitting: *Deleted

## 2012-06-08 NOTE — Telephone Encounter (Signed)
Called patient to make a appointment, no answer lvm asking the patient to call me back.

## 2012-06-09 ENCOUNTER — Telehealth: Payer: Self-pay | Admitting: *Deleted

## 2012-06-09 NOTE — Telephone Encounter (Signed)
Patient already has refills on file for Keppra at pharmacy.  I called to advise she should remain on med, and can take Tylenol OTC, also that she needs to keep appt.  Got no answer.  Left message.

## 2012-06-09 NOTE — Telephone Encounter (Signed)
Patient stated Ir angio looks good and wants to know should she still be taking keppera 250 mg twice a day. Patient also wants to know what over the counter medication she can take should she have a headache.

## 2012-06-09 NOTE — Telephone Encounter (Signed)
She needs to continue Keppra. She can use and all extra strength 2 tablets every 6 hourly as needed for headache. Keep appointment with me.

## 2012-08-04 ENCOUNTER — Telehealth: Payer: Self-pay | Admitting: Neurology

## 2012-08-04 NOTE — Telephone Encounter (Signed)
I called and left a message for the patient that per nurse to avoid drinking alcohol because it can increase the side effects of Keppra and may increase seizure risk.

## 2012-10-19 ENCOUNTER — Encounter: Payer: Self-pay | Admitting: Neurology

## 2012-10-19 ENCOUNTER — Ambulatory Visit (INDEPENDENT_AMBULATORY_CARE_PROVIDER_SITE_OTHER): Payer: Medicare Other | Admitting: Neurology

## 2012-10-19 VITALS — BP 117/65 | HR 78 | Temp 98.1°F | Ht 63.0 in | Wt 172.0 lb

## 2012-10-19 DIAGNOSIS — R209 Unspecified disturbances of skin sensation: Secondary | ICD-10-CM

## 2012-10-19 NOTE — Patient Instructions (Signed)
She may be having personality changes and possible side effects from keppra. Taper Keppra to 250 mg at night for one week and discontinue. If she has breakthrough tingling numbness episodes suggestive of seizures may need to and alternative anticonvulsant. Return for followup in 6 months

## 2012-10-19 NOTE — Progress Notes (Signed)
HPI: [name] is a 25 year or Caucasian lady seen for followup after hospital consultation for brain hemorrhage on 05/06/12.she presented with sudden onset of severe headache followed shortly by tingling and weakness involving the right arm and leg. CT scan of the head at [facility] showed an acute left parietal subarachnoid hemorrhage in the region of the sylvian fissure and left parietal lobe. She was transferred to [facility] where a CT angiogram showed no evidence of intracranial aneurysm or AV malformation. MRI scan of the brain was unremarkable except for localized subarachnoid hemorrhage in the left sylvian fissure.s. The etiology for the origin of subarachnoid hemorrhage remained unclear. Cerebral catheter angiogram was not  done as interventional radiologist was out of town and was scheduled to be done as an outpatient next week. Patient's headache resolved but she started having some intermittent paresthesias on the right side after discharge. Rest of the evaluation in the hospital stay included ESR, ANA panel, coagulation labs which were all negative. She was discharged . 4 days later and on 05/13/12 she developed sudden onset of paresthesias involving the right 40. This happened after she had taken 2 doses of ibuprofen for her headache when she had gone on a trip to [place]. She had a repeat CT scan at [facility] which actually showed slightly improved appearance of the left parietal subarachnoid hemorrhage without any new findings..she had been taking some herbal pills for the last couple of months and was advised to stop them.  10/19/2012 she is seen for followup today after last visit on 05/18/12. She states she's had no further episodes of recurrent procedures after starting the Keppra. She is taking 250 twice a day but has noted that her personality has changed. She feels that she is quite hyper now and very anxious and thinks about small things. Every time she has  some minor tingling or numbness in her hands she is worried about seizures. She does have a history of carpal tunnel in the past. She has since retired from her job and is enjoying her time off and feels more relaxed . She had EEG on 05/19/12 which was normal and 4 vessel cerebral catheter angiogram on 06/06/12 by Dr. Corliss Skains which showed no evidence of AV malformation, aneurysms or cortical venous thrombosis. ROS: 14 system review of systems positive weight gain, rash, itching, allergies, skin sensitivity, joint pain, easy bruising, memory loss, insomnia and anxiety Filed Vitals:   10/19/12 1504  BP: 117/65  Pulse: 78  Temp: 98.1 F (36.7 C)    Physical Exam General: well developed, well nourished middle aged Caucasian lady seated, in no evident distress but appears anxious Head: head normocephalic and atraumatic. Orohparynx benign Neck: supple with no carotid or supraclavicular bruits Cardiovascular: regular rate and rhythm, no murmurs Lungs are clear to auscultation Neurologic Exam Mental Status: Awake and fully alert. Oriented to place and time. Recent and remote memory intact. Attention span, concentration and fund of knowledge appropriate. Mood and affect appropriate.  Cranial Nerves: Fundoscopic exam reveals sharp disc margins. Pupils equal, briskly reactive to light. Extraocular movements full without nystagmus. Visual fields full to confrontation. Hearing intact and symmetric to finer snap. Facial sensation intact. Face, tongue, palate move normally and symmetrically. Neck flexion and extension normal.  Motor: Normal bulk and tone. Normal strength in all tested extremity muscles. Sensory.: intact to tough and pinprick and vibratory.  Coordination: Rapid alternating movements normal in all extremities. Finger-to-nose and heel-to-shin performed accurately bilaterally. Gait and  Station: Arises from chair without difficulty. Stance is normal. Gait demonstrates normal stride length and  balance . Able to heel, toe and tandem walk without difficulty.  Reflexes: 1+ and symmetric. Toes downgoing.     ASSESSMENT::  33 lady with left parietal convexity localize subarachnoid hemorrhage of unclear etiology possibly small   cortical vein thrombosis. 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 If she has any breakthrough paresthesias/seizures may need to consider adding alternative anticonvulsant at that time. Return for followup in 6 months or call earlier if necessary.

## 2013-03-09 IMAGING — CT CT ANGIO HEAD
1 of 11 series · 6 of 33 positions shown · IV contrast (omnipaque)
Comparison: CT head 05/06/2012

CLINICAL DATA: Subarachnoid hemorrhage

CT ANGIOGRAPHY HEAD
TECHNIQUE: Multidetector CT imaging of the head was performed
using the standard protocol during bolus administration of
intravenous contrast.  Multiplanar CT image reconstructions
including MIPs were obtained to evaluate the vascular anatomy.
Contrast: 50mL OMNIPAQUE IOHEXOL 350 MG/ML SOLN

[axial 1x1 · axial · 0.46mm/px · z∈[+192,+299]mm · 6 of 273 slices shown]
[im 39/273  soft-tissue]
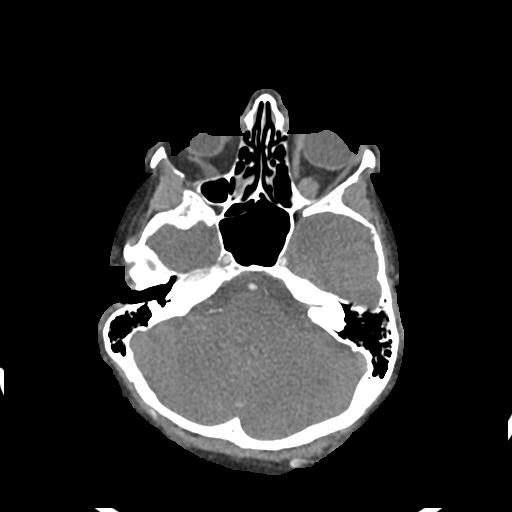
[im 78/273  bone]
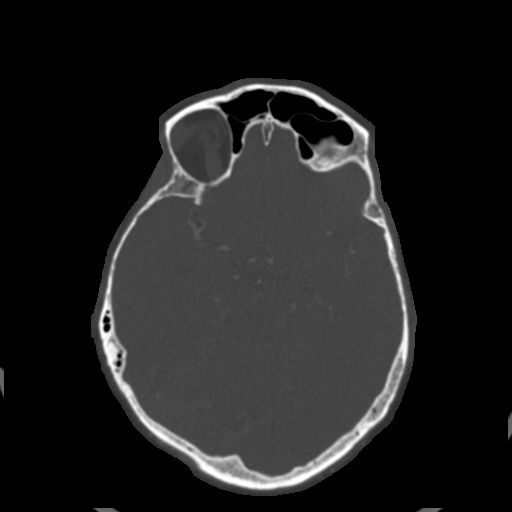
[im 117/273  soft-tissue]
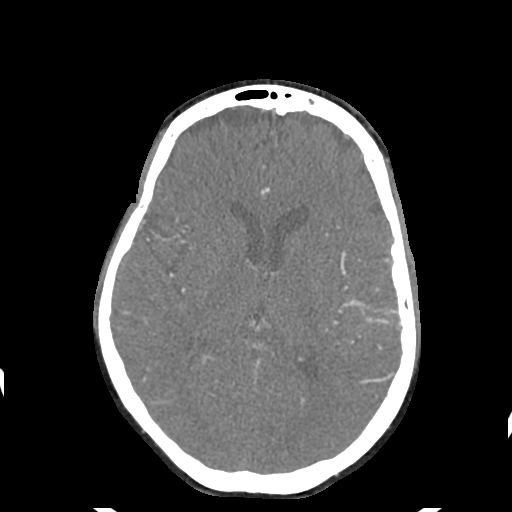
[im 156/273  bone]
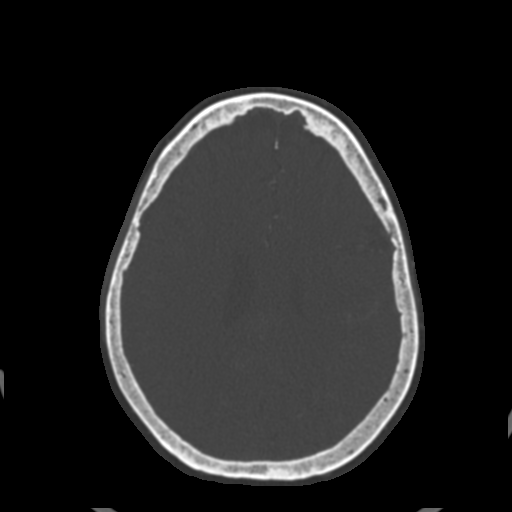
[im 195/273  soft-tissue]
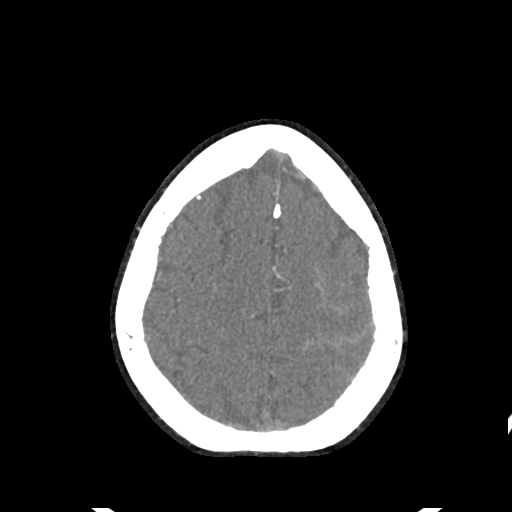
[im 234/273  bone]
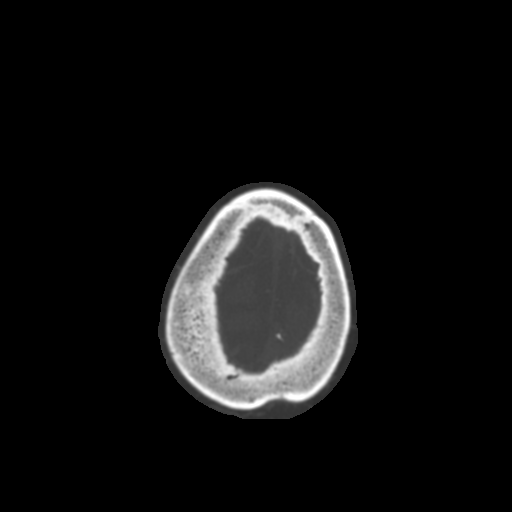

[6 of 33 positions shown; findings below may reference images not displayed]

FINDINGS: Subarachnoid hemorrhage over the convexity on the left
is unchanged from earlier today.  There is a mild amount of
subarachnoid blood present.  No subdural or intraparenchymal
hemorrhage is seen.  There is a small amount of blood in the
sylvian fissure on the left.  There is no blood at the base of the
brain.  No acute infarct or mass.  Ventricle size is normal.  No
enhancing mass lesion is seen following contrast infusion.

Both vertebral arteries are patent to the basilar.  PICA is patent
bilaterally.  The basilar is widely patent.  Bilateral AICA,
superior cerebellar, and posterior cerebral arteries are patent
without stenosis.

Cavernous carotid is patent bilaterally without evidence of
atherosclerotic disease or aneurysm.  Anterior and  middle cerebral
arteries are patent bilaterally without stenosis.

Negative for aneurysm.  No vascular malformation or cause of
subarachnoid hemorrhage is identified.

 Review of the MIP images confirms the above findings.
IMPRESSION: Subarachnoid hemorrhage over the convexity on the left, unchanged.
No cause for hemorrhage is identified on CTA.

## 2013-04-04 IMAGING — XA IR ANGIO INTRA EXTRACRAN SEL COM CAROTID INNOMINATE BILAT MOD SE
1 series · 13 of 24 positions shown · IV contrast (IODINE)
Comparison: CT angiogram of the brain 05/06/2012 and CT scan of the
brain of 05/13/2012.

CLINICAL DATA: Subarachnoid hemorrhage.

BILATERAL COMMON CAROTID ARTERY AND BILATERAL VERTEBRAL ARTERY
ANGIOGRAMS

[Series 300: neuro · 13 of 192 slices shown]
[im 1/192]
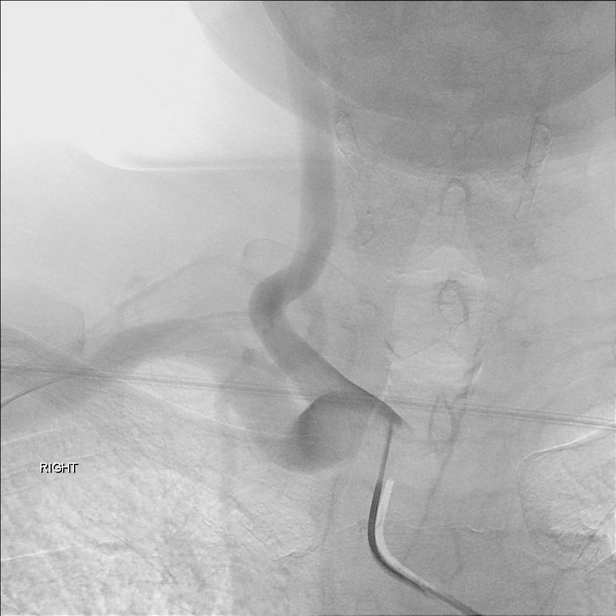
[im 17/192]
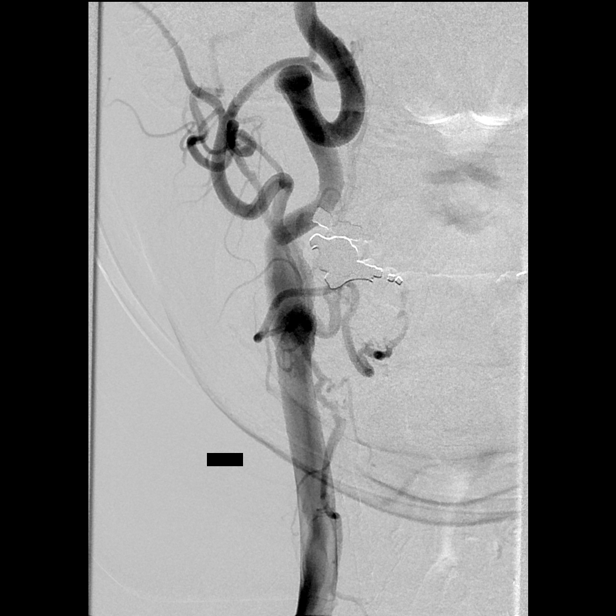
[im 34/192]
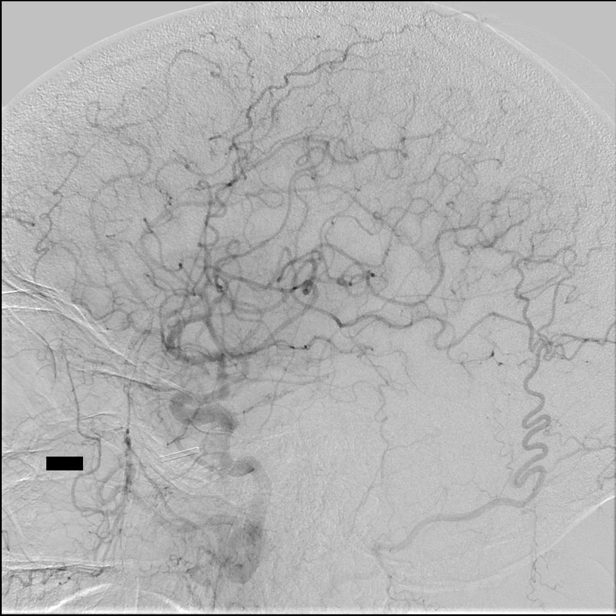
[im 50/192]
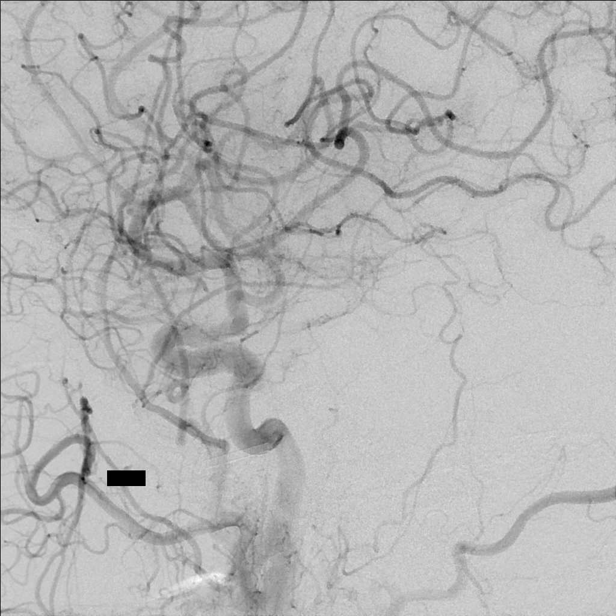
[im 67/192]
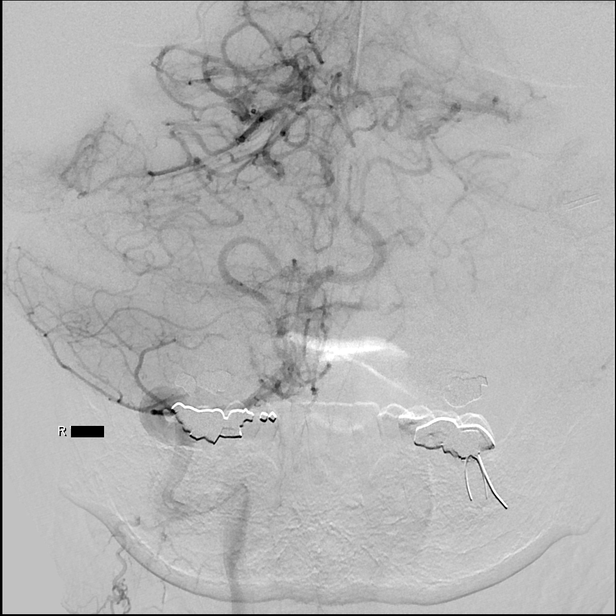
[im 84/192]
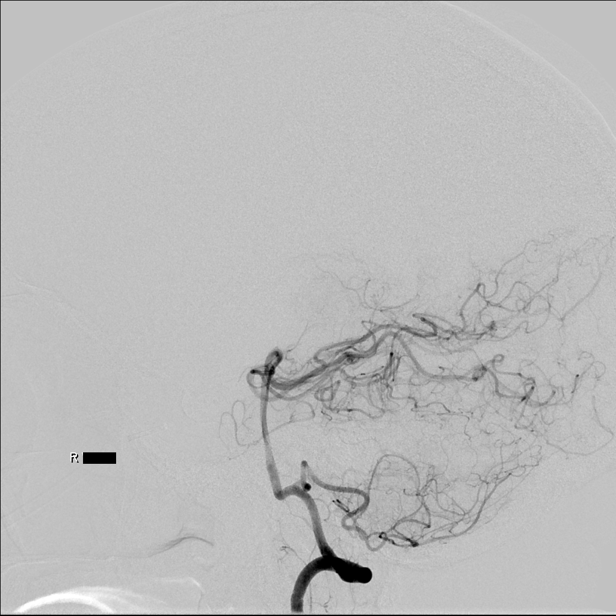
[im 100/192]
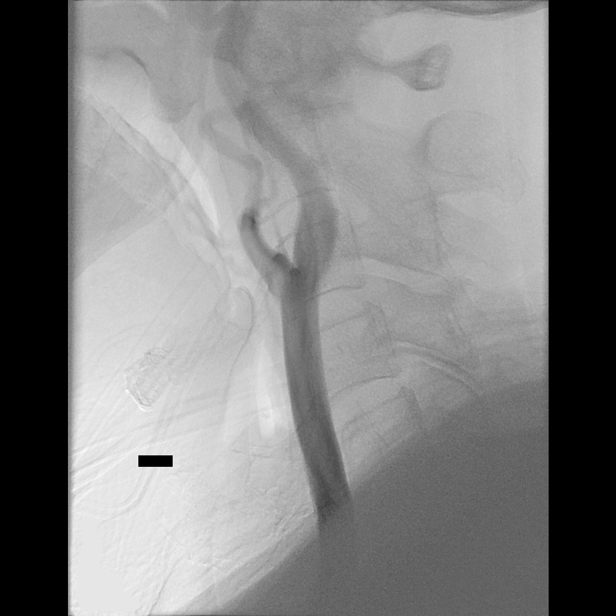
[im 108/192]
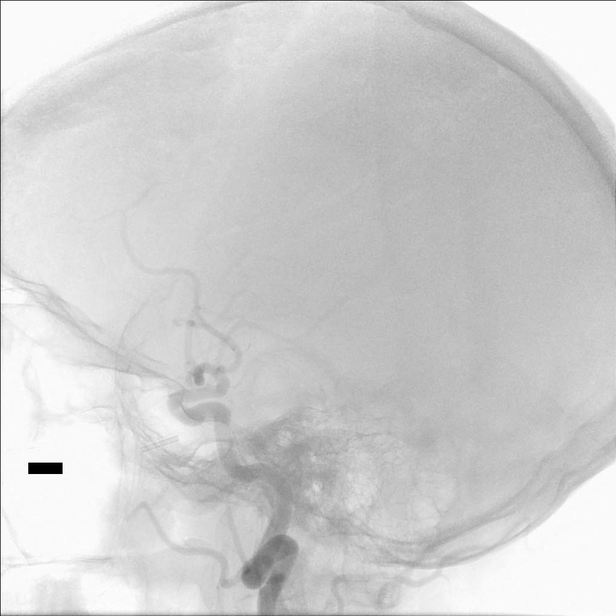
[im 125/192]
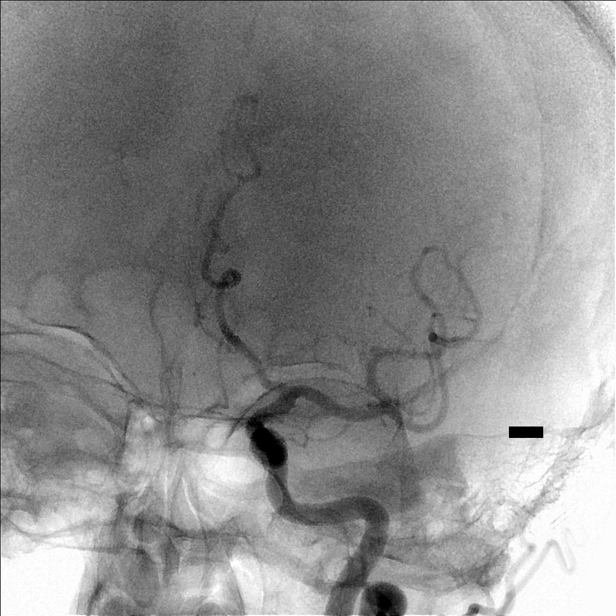
[im 142/192]
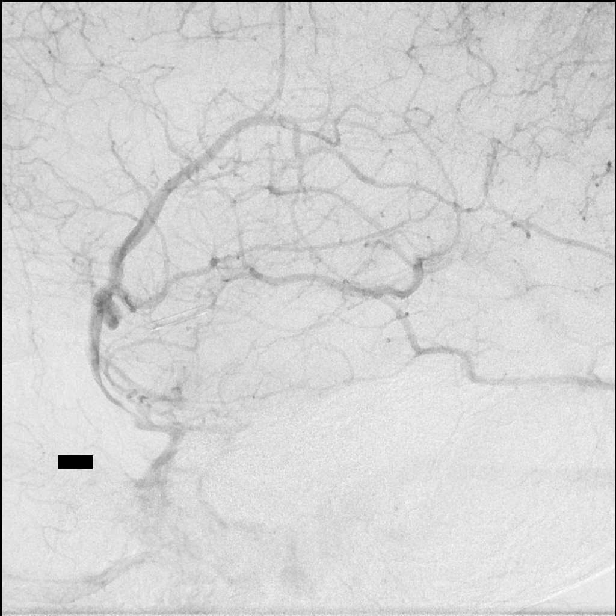
[im 158/192]
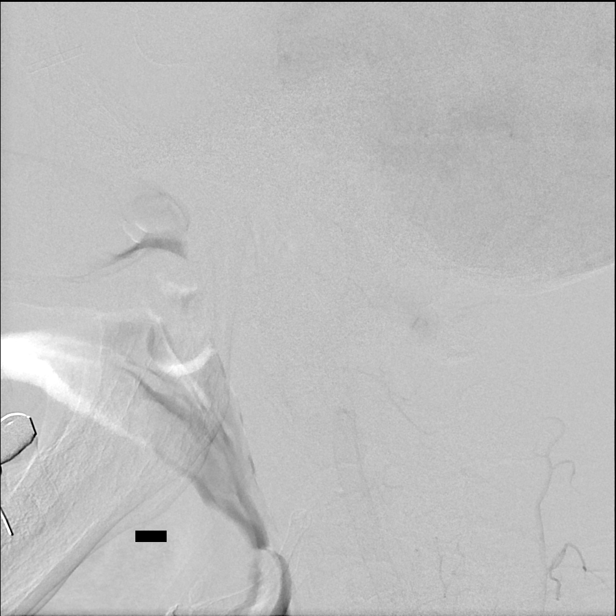
[im 175/192]
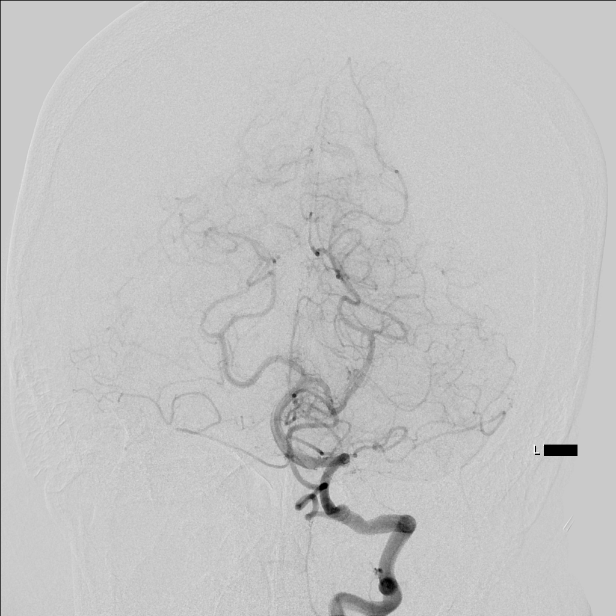
[im 192/192]
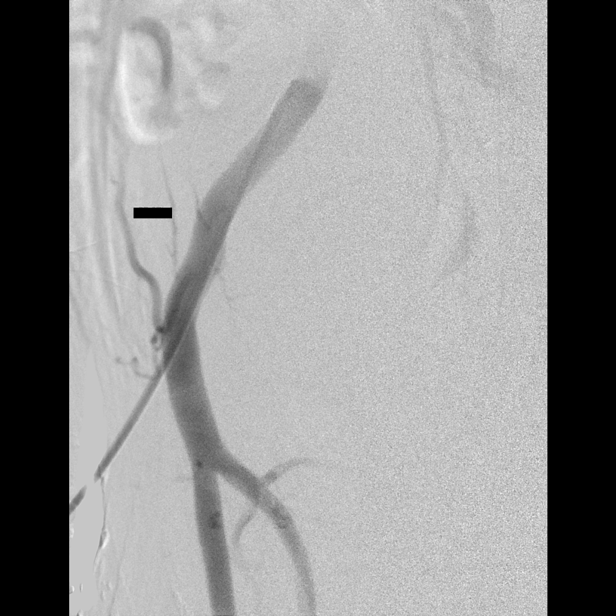

[13 of 24 positions shown; findings below may reference images not displayed]

Following a full explanation of the procedure along with the
potential associated complications, an informed witnessed consent
was obtained.

The right groin was prepped and draped in the usual sterile
fashion.  Thereafter using modified Seldinger technique,
transfemoral access into the right common femoral artery was
obtained without difficulty.  Over a 0.35-inch guidewire, a 5-
French Pinnacle sheath was inserted.  Through this and also over a
0.035-inch guidewire, a 5-French JB1 catheter was advanced to the
aortic arch region and selectively positioned in the right common
carotid artery, the right vertebral artery, the left common carotid
artery and the left vertebral artery.

There were no acute complications.  The patient tolerated the
procedure well.

Medications utilized: Versed 1 mg IV.  Fentanyl 25 mcg IV.

Contrast: [id number] approximately 55 ml.
FINDINGS: The right common carotid arteriogram demonstrates the
right external carotid artery and its major branches to be normal.

The right internal carotid artery at the bulb and its proximal
third is normal.

The middle one-third segment demonstrates a U-shaped configuration
without kinking.

More distally the vessel opacifies normally to the cranial skull
base.

The petrous, cavernous and the supraclinoid segments are normal.

The right middle and the right anterior cerebral arteries opacify
normally into the capillary and venous phases.

The right vertebral artery origin is normal.  The vessel opacifies
normally to the cranial skull base.

There is normal opacification of the right posterior inferior
cerebellar artery and the right vertebrobasilar junction.

The basilar artery, the posterior cerebral arteries, the superior
cerebellar arteries and the anterior-inferior cerebellar arteries
opacify normally into the capillary and venous phases.  Unopacified
blood is seen in the basilar artery from the contralateral
vertebral artery.

The left common carotid arteriogram demonstrates the left external
carotid artery and its major branches to be normal.

The left internal carotid artery at the bulb is normal. There is a
U-shaped configuration of the distal cervical segment of the left
internal carotid artery.

More distally the vessel opacifies normally to the cervical-petrous
junction.

The petrous, cavernous and the supraclinoid segments demonstrate
wide patency.

The left middle and the left anterior cerebral arteries opacify
normally into the capillary and venous phases.

The left vertebral artery origin is normal.  The vessel has mild
tortuosity in its proximal one-third.

More distally the vessel opacifies normally to the cranial skull
base.

There is normal opacification of the left posterior inferior
cerebellar artery and the left vertebrobasilar junction.

The basilar artery, the posterior cerebral arteries, the superior
cerebellar arteries and the anterior inferior cerebellar arteries
opacify normally into the capillary and venous phases.  Unopacified
blood is seen in the posterior cerebral arteries from the
contralateral right vertebral artery.

IMPRESSION
1.  Angiographically no evidence of arteriovenous malformation,
aneurysms, dissections, occlusions, stenosis or of arteriovenous
shunting.
2. Venous outflow within normal limits.

## 2013-04-23 ENCOUNTER — Ambulatory Visit: Payer: BC Managed Care – PPO | Admitting: Neurology

## 2013-04-24 ENCOUNTER — Ambulatory Visit: Payer: Medicare Other | Admitting: Neurology

## 2013-08-08 ENCOUNTER — Encounter (INDEPENDENT_AMBULATORY_CARE_PROVIDER_SITE_OTHER): Payer: Self-pay

## 2013-08-08 ENCOUNTER — Encounter: Payer: Self-pay | Admitting: Neurology

## 2013-08-08 ENCOUNTER — Ambulatory Visit (INDEPENDENT_AMBULATORY_CARE_PROVIDER_SITE_OTHER): Payer: Medicare Other | Admitting: Neurology

## 2013-08-08 VITALS — BP 116/65 | HR 63 | Ht 63.0 in | Wt 173.0 lb

## 2013-08-08 DIAGNOSIS — R209 Unspecified disturbances of skin sensation: Secondary | ICD-10-CM

## 2013-08-08 NOTE — Patient Instructions (Signed)
I had a discussion with  the patient with regards to her prior episodes of transient paresthesias which now seem fairly stable even though she has been off Keppra for more than 6 months. I agree with staying off medications for now but have instructed her to call me if she has recurrent episodes. Return for followup in 6 months or call earlier if necessary.

## 2013-08-08 NOTE — Progress Notes (Signed)
HPI: [name] is a 61 year or Caucasian lady seen for followup after hospital consultation for brain hemorrhage on 05/06/12.she presented with sudden onset of severe headache followed shortly by tingling and weakness involving the right arm and leg. CT scan of the head at [facility] showed an acute left parietal subarachnoid hemorrhage in the region of the sylvian fissure and left parietal lobe. She was transferred to [facility] where a CT angiogram showed no evidence of intracranial aneurysm or AV malformation. MRI scan of the brain was unremarkable except for localized subarachnoid hemorrhage in the left sylvian fissure.s. The etiology for the origin of subarachnoid hemorrhage remained unclear. Cerebral catheter angiogram was not  done as interventional radiologist was out of town and was scheduled to be done as an outpatient next week. Patient's headache resolved but she started having some intermittent paresthesias on the right side after discharge. Rest of the evaluation in the hospital stay included ESR, ANA panel, coagulation labs which were all negative. She was discharged . 4 days later and on 05/13/12 she developed sudden onset of paresthesias involving the right 40. This happened after she had taken 2 doses of ibuprofen for her headache when [place] had gone [place]. She had a repeat CT scan at [facility] which actually showed slightly improved appearance of the left parietal subarachnoid hemorrhage without any new findings..she had been taking some herbal pills for the last couple of months and was advised to stop them.  10/19/2012 she is seen for followup today after last visit on 05/18/12. She states she's had no further episodes of recurrent procedures after starting the Keppra. She is taking 250 twice a day but has noted that her personality has changed. She feels that she is quite hyper now and very anxious and thinks about small things. Every time she has  some minor tingling or numbness in her hands she is worried about seizures. She does have a history of carpal tunnel in the past. She has since retired from her job and is enjoying her time off and feels more relaxed . She had EEG on 05/19/12 which was normal and 4 vessel cerebral catheter angiogram on 06/06/12 by Dr. Estanislado Pandy which showed no evidence of AV malformation, aneurysms or cortical venous thrombosis. Update 08/08/2013 : She returns for followup of the last visit in August 2014. She has discontinued keppra but continues to do well without recurrent episodes of paresthesias. She did have increased anxiety on Keppra and states that that improved after she discontinued it. She has very transient episode of numbness of the left upper lip this lasted less than an hour about a month ago. She is quite active and recently completed a final K. race. She was also started a new non-for Facilities manager and is in good health and has no other complaints. ROS: 14 system review of systems positive for activity change, discharge and itching, numbness, joint pain, muscle cramps, insomnia and anxiety  and all other systems negative  Filed Vitals:   08/08/13 1457  BP: 116/65  Pulse: 63    Physical Exam General: well developed, well nourished middle aged Caucasian lady seated, in no evident distress but appears anxious Head: head normocephalic and atraumatic. Orohparynx benign Neck: supple with no carotid or supraclavicular bruits Cardiovascular: regular rate and rhythm, no murmurs Lungs are clear to auscultation Neurologic Exam Mental Status: Awake and fully alert. Oriented to place and time. Recent and remote memory intact. Attention span,  concentration and fund of knowledge appropriate. Mood and affect appropriate.  Cranial Nerves: Fundoscopic exam reveals sharp disc margins. Pupils equal, briskly reactive to light. Extraocular movements full without nystagmus. Visual fields full to confrontation.  Hearing intact and symmetric to finer snap. Facial sensation intact. Face, tongue, palate move normally and symmetrically. Neck flexion and extension normal.  Motor: Normal bulk and tone. Normal strength in all tested extremity muscles. Sensory.: intact to tough and pinprick and vibratory.  Coordination: Rapid alternating movements normal in all extremities. Finger-to-nose and heel-to-shin performed accurately bilaterally. Gait and Station: Arises from chair without difficulty. Stance is normal. Gait demonstrates normal stride length and balance . Able to heel, toe and tandem walk without difficulty.  Reflexes: 1+ and symmetric. Toes downgoing.     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   Recurrent episodes of sensory paresthesias on the right hemibody possible simple partial seizures but now  Doing well even off keppra. PLAN: I had a discussion with  the patient with regards to her prior episodes of transient paresthesias which now seem fairly stable even though she has been off Keppra for more than 6 months. I agree with staying off medications for now but have instructed her to call me if she has recurrent episodes. Return for followup in 6 months or call earlier if necessary.

## 2013-10-09 ENCOUNTER — Telehealth: Payer: Self-pay | Admitting: Neurology

## 2013-10-09 ENCOUNTER — Encounter: Payer: Self-pay | Admitting: Neurology

## 2013-10-09 NOTE — Telephone Encounter (Signed)
Spoke to patient regarding rescheduling 02/05/14 appointment per DR. Sethi's schedule, patient verbalized understanding, mailed letter with new appointment time.

## 2014-02-05 ENCOUNTER — Ambulatory Visit: Payer: Medicare Other | Admitting: Neurology

## 2014-06-13 ENCOUNTER — Ambulatory Visit: Payer: Medicare Other | Admitting: Neurology

## 2017-07-18 ENCOUNTER — Encounter: Payer: Self-pay | Admitting: *Deleted

## 2017-07-18 ENCOUNTER — Telehealth: Payer: Self-pay | Admitting: *Deleted

## 2017-07-18 NOTE — Telephone Encounter (Signed)
REFERRAL SENT TO SCHEDULING FROM [name], [place] [facility] [phone]

## 2017-09-12 ENCOUNTER — Encounter: Payer: Self-pay | Admitting: *Deleted

## 2017-09-20 NOTE — Progress Notes (Signed)
Cardiology Office Note   Date:  09/28/2017   ID:  Debbie Phelps, DOB 06-26-1945, MRN [id number]  PCP:  [name], MD  Cardiologist:   [name], MD   No chief complaint on file.     History of Present Illness: Debbie Phelps is a 72 y.o. female who presents for consultation regarding palpitations Referred by [name] and primary [name].  CRF;s include HTN, HLD and pre DM.  2014 history of SAH Localized SAH in left sylvian fissure with no aneurysm or AVM. ? If she had small cortical vein thrombosis Seen in [facility] ER with palpitations and ? Anxiety attack 07/10/17. R/O normal thyroid and negative drug screen She is divorced Retired Chiropractor for anxiety PRN   Labs reviewed from 7/15 LDL 140 A1c 6.4 others normal   She has seen therapist before for anxiety and was on zoloft Has not used Klonopin yet No further palpitations    Past Medical History:  Diagnosis Date  . Brain bleed (HCC)   . Depression   . Diverticulitis   . Hyperlipidemia   . Hypertension   . Prediabetes 01/2011  . Subarachnoid hemorrhage (HCC) 2014    Past Surgical History:  Procedure Laterality Date  . EYE SURGERY    . MYOMECTOMY  2000  . TONSILLECTOMY    . TUMOR REMOVAL     UTERINE     Current Outpatient Medications  Medication Sig Dispense Refill  . Ascorbic Acid (VITAMIN C) 1000 MG tablet Take 1,000 mg by mouth daily.    . Calcium Carbonate-Vitamin D (CALCIUM + D PO) Take 1 tablet by mouth daily.    . clonazePAM (KLONOPIN) 0.5 MG tablet Take 0.5 mg by mouth as directed.    . Loratadine (CLARITIN PO) Take 1 tablet by mouth daily.    . Multiple Vitamin (MULTIVITAMIN WITH MINERALS) TABS Take 1 tablet by mouth daily.     No current facility-administered medications for this visit.     Allergies:   Sulfa antibiotics    Social History:  The patient  reports that she has never smoked. She has never used smokeless tobacco. She reports that she drinks  about 1.2 oz of alcohol per week. She reports that she does not use drugs.   Family History:  The patient's family history includes Lymphoma (age of onset: 71) in her mother; Other (age of onset: 59) in her father.    ROS:  Please see the history of present illness.   Otherwise, review of systems are positive for none.   All other systems are reviewed and negative.    PHYSICAL EXAM: VS:  BP 104/62   Pulse 76   Ht 5\' 3"  (1.6 m)   Wt 167 lb (75.8 kg)   SpO2 98%   BMI 29.58 kg/m  , BMI Body mass index is 29.58 kg/m. Affect appropriate Healthy:  appears stated age HEENT: normal Neck supple with no adenopathy JVP normal no bruits no thyromegaly Lungs clear with no wheezing and good diaphragmatic motion Heart:  S1/S2 no murmur, no rub, gallop or click PMI normal Abdomen: benighn, BS positve, no tenderness, no AAA no bruit.  No HSM or HJR Distal pulses intact with no bruits No edema Neuro non-focal Skin warm and dry No muscular weakness    EKG: 2014 SR LAD rate 78 non specific ST changes 07/10/17 SR rate 89 low voltage LAFB    Recent Labs: No results found for requested labs within last  8760 hours.    Lipid Panel No results found for: CHOL, TRIG, HDL, CHOLHDL, VLDL, LDLCALC, LDLDIRECT    Wt Readings from Last 3 Encounters:  09/28/17 167 lb (75.8 kg)  08/08/13 173 lb (78.5 kg)  10/19/12 172 lb (78 kg)      Other studies Reviewed: Additional studies/ records that were reviewed today include: Notes from [facility]y notes Dr Pearlean BrownieSethi ECG and MRI/CT .    ASSESSMENT AND PLAN:  1.  Palpitations benign related to anxiety attack no need for monitor  2. HTN:  Well controlled.  Continue current medications and low sodium Dash type diet.   3. HLD:  Continue statin labs with primary 4. Pre-DM:  Discussed low carb diet f/u A1c primary  5. SAH:  F/u Sethi stable off Keppra   She has multiple cardiac risk factors and age will order exercise myovue  to r/o CAD   Current medicines are reviewed at length with the patient today.  The patient does not have concerns regarding medicines.  The following changes have been made:  no change  Labs/ tests ordered today include: Ex Myovue   Orders Placed This Encounter  Procedures  . MYOCARDIAL PERFUSION IMAGING  . EKG 12-Lead     Disposition:   FU with cardiology PRN      Signed, Charlton HawsPeter Rakim Moone, MD  09/28/2017 8:51 AM    [facility] [address], [place], [place]  [address] Phone: [phone]) [phone]; Fax: [phone]

## 2017-09-28 ENCOUNTER — Telehealth (HOSPITAL_COMMUNITY): Payer: Self-pay | Admitting: *Deleted

## 2017-09-28 ENCOUNTER — Ambulatory Visit: Payer: Medicare Other | Admitting: Cardiovascular Disease

## 2017-09-28 ENCOUNTER — Encounter

## 2017-09-28 ENCOUNTER — Encounter: Payer: Self-pay | Admitting: Cardiovascular Disease

## 2017-09-28 VITALS — BP 104/62 | HR 76 | Ht 63.0 in | Wt 167.0 lb

## 2017-09-28 DIAGNOSIS — I10 Essential (primary) hypertension: Secondary | ICD-10-CM

## 2017-09-28 DIAGNOSIS — R079 Chest pain, unspecified: Secondary | ICD-10-CM | POA: Diagnosis not present

## 2017-09-28 DIAGNOSIS — E782 Mixed hyperlipidemia: Secondary | ICD-10-CM | POA: Diagnosis not present

## 2017-09-28 DIAGNOSIS — R002 Palpitations: Secondary | ICD-10-CM

## 2017-09-28 NOTE — Patient Instructions (Addendum)
Medication Instructions:  Your physician recommends that you continue on your current medications as directed. Please refer to the Current Medication list given to you today.  Labwork: NONE  Testing/Procedures: Your physician has requested that you have en exercise stress myoview. For further information please visit [email or web address]. Please follow instruction sheet, as given.  Follow-Up: Your physician wants you to follow-up as needed Dr. Eden EmmsNishan.    If you need a refill on your cardiac medications before your next appointment, please call your pharmacy.

## 2017-09-28 NOTE — Telephone Encounter (Signed)
Patient given detailed instructions per Myocardial Perfusion Study Information Sheet for the test on 10/04/17 at 10;30. Patient notified to arrive 15 minutes early and that it is imperative to arrive on time for appointment to keep from having the test rescheduled.  If you need to cancel or reschedule your appointment, please call the office within 24 hours of your appointment. . Patient verbalized understanding.Debbie DolinSharon S Brooks

## 2017-10-04 ENCOUNTER — Ambulatory Visit (HOSPITAL_COMMUNITY): Payer: Medicare Other | Attending: Cardiovascular Disease

## 2017-10-04 ENCOUNTER — Encounter (HOSPITAL_COMMUNITY): Payer: Self-pay

## 2017-10-04 DIAGNOSIS — R079 Chest pain, unspecified: Secondary | ICD-10-CM | POA: Insufficient documentation

## 2017-10-04 DIAGNOSIS — R9439 Abnormal result of other cardiovascular function study: Secondary | ICD-10-CM | POA: Diagnosis not present

## 2017-10-04 DIAGNOSIS — I10 Essential (primary) hypertension: Secondary | ICD-10-CM | POA: Diagnosis not present

## 2017-10-04 DIAGNOSIS — E782 Mixed hyperlipidemia: Secondary | ICD-10-CM

## 2017-10-04 DIAGNOSIS — R002 Palpitations: Secondary | ICD-10-CM | POA: Insufficient documentation

## 2017-10-04 LAB — MYOCARDIAL PERFUSION IMAGING
CHL CUP MPHR: 149 {beats}/min
CHL CUP NUCLEAR SDS: 9
CHL CUP NUCLEAR SSS: 14
CSEPEDS: 0 s
Estimated workload: 7 METS
Exercise duration (min): 6 min
LHR: 0.35
LV dias vol: 63 mL (ref 46–106)
LV sys vol: 17 mL
Peak HR: 144 {beats}/min
Percent HR: 96 %
Rest HR: 79 {beats}/min
SRS: 5
TID: 1

## 2017-10-04 MED ORDER — TECHNETIUM TC 99M TETROFOSMIN IV KIT
10.7000 | PACK | Freq: Once | INTRAVENOUS | Status: AC | PRN
  Administered 2017-10-04: 10.7 via INTRAVENOUS
  Filled 2017-10-04: qty 11

## 2017-10-04 MED ORDER — TECHNETIUM TC 99M TETROFOSMIN IV KIT
32.1000 | PACK | Freq: Once | INTRAVENOUS | Status: AC | PRN
  Administered 2017-10-04: 32.1 via INTRAVENOUS
  Filled 2017-10-04: qty 33

## 2019-04-25 DIAGNOSIS — H00016 Hordeolum externum left eye, unspecified eyelid: Secondary | ICD-10-CM | POA: Diagnosis not present

## 2019-04-25 DIAGNOSIS — H1045 Other chronic allergic conjunctivitis: Secondary | ICD-10-CM | POA: Diagnosis not present

## 2019-05-16 DIAGNOSIS — J3489 Other specified disorders of nose and nasal sinuses: Secondary | ICD-10-CM | POA: Diagnosis not present

## 2019-05-16 DIAGNOSIS — Z20828 Contact with and (suspected) exposure to other viral communicable diseases: Secondary | ICD-10-CM | POA: Diagnosis not present

## 2020-09-23 ENCOUNTER — Ambulatory Visit: Payer: Self-pay | Admitting: Nurse Practitioner

## 2020-12-15 ENCOUNTER — Telehealth: Payer: Self-pay | Admitting: Nurse Practitioner

## 2020-12-15 ENCOUNTER — Telehealth: Payer: Self-pay

## 2020-12-30 NOTE — Telephone Encounter (Signed)
The [facility] office called and stated they were on scene with a patient of our that was found deceased, they spoke with Damon Cox.

## 2020-12-30 NOTE — Telephone Encounter (Signed)
I spoke with deputy who was on scene.  Pt called 911 herself. She was having chest pain. When fire department arrived, she was still conscious. She ost consciousness. CPR was conducted unsuccessfully.Time of death: 11:07 am.

## 2020-12-30 DEATH — deceased
